# Patient Record
Sex: Female | Born: 1970 | Race: White | Hispanic: No | Marital: Married | Smoking: Current every day smoker
Health system: Southern US, Community
[De-identification: ages and names within clinical notes are randomized; demographics above are authoritative.]

## PROBLEM LIST (undated history)

## (undated) DIAGNOSIS — I10 Essential (primary) hypertension: Secondary | ICD-10-CM

## (undated) DIAGNOSIS — F419 Anxiety disorder, unspecified: Secondary | ICD-10-CM

## (undated) DIAGNOSIS — E7800 Pure hypercholesterolemia, unspecified: Secondary | ICD-10-CM

## (undated) HISTORY — PX: CERVICAL CONE BIOPSY: SUR198

## (undated) HISTORY — PX: TUBAL LIGATION: SHX77

---

## 2001-01-15 ENCOUNTER — Inpatient Hospital Stay (HOSPITAL_COMMUNITY): Admission: AD | Admit: 2001-01-15 | Discharge: 2001-01-15 | Payer: Self-pay | Admitting: *Deleted

## 2001-01-27 ENCOUNTER — Inpatient Hospital Stay (HOSPITAL_COMMUNITY): Admission: AD | Admit: 2001-01-27 | Discharge: 2001-01-29 | Payer: Self-pay | Admitting: Obstetrics & Gynecology

## 2005-08-10 ENCOUNTER — Ambulatory Visit: Payer: Self-pay | Admitting: Oncology

## 2014-12-05 ENCOUNTER — Encounter (HOSPITAL_BASED_OUTPATIENT_CLINIC_OR_DEPARTMENT_OTHER): Payer: Self-pay | Admitting: *Deleted

## 2014-12-05 ENCOUNTER — Emergency Department (HOSPITAL_BASED_OUTPATIENT_CLINIC_OR_DEPARTMENT_OTHER)
Admission: EM | Admit: 2014-12-05 | Discharge: 2014-12-05 | Disposition: A | Discharge status: Home / Self Care | Payer: Managed Care, Other (non HMO) | Attending: Emergency Medicine | Admitting: Emergency Medicine

## 2014-12-05 ENCOUNTER — Emergency Department (HOSPITAL_BASED_OUTPATIENT_CLINIC_OR_DEPARTMENT_OTHER): Payer: Managed Care, Other (non HMO)

## 2014-12-05 DIAGNOSIS — Z79899 Other long term (current) drug therapy: Secondary | ICD-10-CM | POA: Insufficient documentation

## 2014-12-05 DIAGNOSIS — Z792 Long term (current) use of antibiotics: Secondary | ICD-10-CM | POA: Diagnosis not present

## 2014-12-05 DIAGNOSIS — Z720 Tobacco use: Secondary | ICD-10-CM | POA: Diagnosis not present

## 2014-12-05 DIAGNOSIS — Z8639 Personal history of other endocrine, nutritional and metabolic disease: Secondary | ICD-10-CM | POA: Insufficient documentation

## 2014-12-05 DIAGNOSIS — Z8659 Personal history of other mental and behavioral disorders: Secondary | ICD-10-CM | POA: Diagnosis not present

## 2014-12-05 DIAGNOSIS — M25512 Pain in left shoulder: Secondary | ICD-10-CM | POA: Diagnosis not present

## 2014-12-05 DIAGNOSIS — I10 Essential (primary) hypertension: Secondary | ICD-10-CM | POA: Diagnosis not present

## 2014-12-05 DIAGNOSIS — Z880 Allergy status to penicillin: Secondary | ICD-10-CM | POA: Insufficient documentation

## 2014-12-05 DIAGNOSIS — Z7982 Long term (current) use of aspirin: Secondary | ICD-10-CM | POA: Diagnosis not present

## 2014-12-05 DIAGNOSIS — R079 Chest pain, unspecified: Secondary | ICD-10-CM | POA: Diagnosis present

## 2014-12-05 HISTORY — DX: Pure hypercholesterolemia, unspecified: E78.00

## 2014-12-05 HISTORY — DX: Essential (primary) hypertension: I10

## 2014-12-05 HISTORY — DX: Anxiety disorder, unspecified: F41.9

## 2014-12-05 LAB — TROPONIN I: Troponin I: <0.03 ng/mL (ref <0.031)

## 2014-12-05 NOTE — Discharge Instructions (Signed)
You have been diagnosed by your caregiver as having chest wall pain. SEEK IMMEDIATE MEDICAL ATTENTION IF: You develop a fever.  Your chest pains become severe or intolerable.  You develop new, unexplained symptoms (problems).  You develop shortness of breath, nausea, vomiting, sweating or feel light headed.  You develop a new cough or you cough up blood.    Chest Pain (Nonspecific) It is often hard to give a specific diagnosis for the cause of chest pain. There is always a chance that your pain could be related to something serious, such as a heart attack or a blood clot in the lungs. You need to follow up with your health care provider for further evaluation. CAUSES   Heartburn.  Pneumonia or bronchitis.  Anxiety or stress.  Inflammation around your heart (pericarditis) or lung (pleuritis or pleurisy).  A blood clot in the lung.  A collapsed lung (pneumothorax). It can develop suddenly on its own (spontaneous pneumothorax) or from trauma to the chest.  Shingles infection (herpes zoster virus). The chest wall is composed of bones, muscles, and cartilage. Any of these can be the source of the pain.  The bones can be bruised by injury.  The muscles or cartilage can be strained by coughing or overwork.  The cartilage can be affected by inflammation and become sore (costochondritis). DIAGNOSIS  Lab tests or other studies may be needed to find the cause of your pain. Your health care provider may have you take a test called an ambulatory electrocardiogram (ECG). An ECG records your heartbeat patterns over a 24-hour period. You may also have other tests, such as:  Transthoracic echocardiogram (TTE). During echocardiography, sound waves are used to evaluate how blood flows through your heart.  Transesophageal echocardiogram (TEE).  Cardiac monitoring. This allows your health care provider to monitor your heart rate and rhythm in real time.  Holter monitor. This is a portable  device that records your heartbeat and can help diagnose heart arrhythmias. It allows your health care provider to track your heart activity for several days, if needed.  Stress tests by exercise or by giving medicine that makes the heart beat faster. TREATMENT   Treatment depends on what may be causing your chest pain. Treatment may include:  Acid blockers for heartburn.  Anti-inflammatory medicine.  Pain medicine for inflammatory conditions.  Antibiotics if an infection is present.  You may be advised to change lifestyle habits. This includes stopping smoking and avoiding alcohol, caffeine, and chocolate.  You may be advised to keep your head raised (elevated) when sleeping. This reduces the chance of acid going backward from your stomach into your esophagus. Most of the time, nonspecific chest pain will improve within 2-3 days with rest and mild pain medicine.  HOME CARE INSTRUCTIONS   If antibiotics were prescribed, take them as directed. Finish them even if you start to feel better.  For the next few days, avoid physical activities that bring on chest pain. Continue physical activities as directed.  Do not use any tobacco products, including cigarettes, chewing tobacco, or electronic cigarettes.  Avoid drinking alcohol.  Only take medicine as directed by your health care provider.  Follow your health care provider's suggestions for further testing if your chest pain does not go away.  Keep any follow-up appointments you made. If you do not go to an appointment, you could develop lasting (chronic) problems with pain. If there is any problem keeping an appointment, call to reschedule. SEEK MEDICAL CARE IF:   Your  chest pain does not go away, even after treatment.  You have a rash with blisters on your chest.  You have a fever. SEEK IMMEDIATE MEDICAL CARE IF:   You have increased chest pain or pain that spreads to your arm, neck, jaw, back, or abdomen.  You have  shortness of breath.  You have an increasing cough, or you cough up blood.  You have severe back or abdominal pain.  You feel nauseous or vomit.  You have severe weakness.  You faint.  You have chills. This is an emergency. Do not wait to see if the pain will go away. Get medical help at once. Call your local emergency services (911 in [place] Do not drive yourself to the hospital. MAKE SURE YOU:   Understand these instructions.  Will watch your condition.  Will get help right away if you are not doing well or get worse. Document Released: 01/21/2005 Document Revised: 04/18/2013 Document Reviewed: 11/17/2007 [facility] Patient Information 2015 [place], [place]. This information is not intended to replace advice given to you by your health care provider. Make sure you discuss any questions you have with your health care provider.

## 2014-12-05 NOTE — ED Provider Notes (Signed)
CSN: [id number]     Arrival date & time 12/05/14  1706 History   First MD Initiated Contact with Patient 12/05/14 1742     Chief Complaint  Patient presents with  . Chest Pain     (Consider location/radiation/quality/duration/timing/severity/associated sxs/prior Treatment) HPI   Rachel Mccormick Is a 44 year old female with a past medical history of hypertension, hypercholesterolemia, obesity and smoking who presents emergency Department with chief complaint of left chest pain and left arm pain. Patient states her symptoms started several days ago when she developed a bad tooth abscess. She has since had the tooth pulled. Patient complains of left-sided chest pain which she describes as sharp, lasting only seconds at a time, she has associated shortness of breath at that time. She denies nausea, chest pressure, diaphoresis, shortness of breath. She is associated left arm achiness. She states that her left arm pops. She denies severe pain with range of motion, numbness or tingling in the left arm, jaw pain. Patient denies unilateral leg swelling, recent confinement, history of DVTs. She does not take any exogenous estrogens.  Past Medical History  Diagnosis Date  . Hypertension   . Anxiety   . Hypercholesterolemia    Past Surgical History  Procedure Laterality Date  . Cesarean section    . Tubal ligation    . Cervical cone biopsy     No family history on file. Social History  Substance Use Topics  . Smoking status: Current Every Day Smoker  . Smokeless tobacco: None  . Alcohol Use: No   OB History    No data available     Review of Systems  Ten systems reviewed and are negative for acute change, except as noted in the HPI.    Allergies  Effexor; Lisinopril; Penicillins; and Excedrin extra strength  Home Medications   Prior to Admission medications   Medication Sig Start Date End Date Taking? Authorizing Provider  aspirin 81 MG tablet Take 81 mg by mouth daily.   Yes  Historical Provider, MD  clindamycin (CLEOCIN) 300 MG capsule Take 300 mg by mouth 3 (three) times daily.   Yes Historical Provider, MD  gemfibrozil (LOPID) 600 MG tablet Take 600 mg by mouth 2 (two) times daily before a meal.   Yes Historical Provider, MD  LORazepam (ATIVAN) 1 MG tablet Take 1 mg by mouth every 8 (eight) hours.   Yes Historical Provider, MD  metoprolol succinate (TOPROL-XL) 25 MG 24 hr tablet Take 25 mg by mouth daily.   Yes Historical Provider, MD  Multiple Vitamin (MULTIVITAMIN) tablet Take 1 tablet by mouth daily.   Yes Historical Provider, MD  omega-3 acid ethyl esters (LOVAZA) 1 G capsule Take 1 g by mouth 2 (two) times daily.   Yes Historical Provider, MD  oxycodone (OXY-IR) 5 MG capsule Take 5 mg by mouth every 4 (four) hours as needed.   Yes Historical Provider, MD   BP 133/87 mmHg  Pulse 73  Temp(Src) 98.4 F (36.9 C) (Oral)  Resp 18  Ht 5\' 6"  (1.676 m)  Wt 193 lb (87.544 kg)  BMI 31.17 kg/m2  SpO2 99%  LMP 11/21/2014 Physical Exam  Constitutional: She is oriented to person, place, and time. She appears well-developed and well-nourished. No distress.  HENT:  Head: Normocephalic and atraumatic.  Eyes: Conjunctivae are normal. No scleral icterus.  Neck: Normal range of motion.  Cardiovascular: Normal rate, regular rhythm and normal heart sounds.  Exam reveals no gallop and no friction rub.   No murmur  heard. Pulmonary/Chest: Effort normal and breath sounds normal. No respiratory distress.  Abdominal: Soft. Bowel sounds are normal. She exhibits no distension and no mass. There is no tenderness. There is no guarding.  Neurological: She is alert and oriented to person, place, and time.  Skin: Skin is warm and dry. She is not diaphoretic.  Nursing note and vitals reviewed.   ED Course  Procedures (including critical care time) Labs Review Labs Reviewed  TROPONIN I    Imaging Review Dg Chest 2 View  12/05/2014   CLINICAL DATA:  Left-sided chest pain for  1 day  EXAM: CHEST  2 VIEW  COMPARISON:  None.  FINDINGS: There is no edema or consolidation. The heart size and pulmonary vascularity are within normal limits. No adenopathy. There is degenerative change in the thoracic spine. No pneumothorax.  IMPRESSION: No edema or consolidation.   Electronically Signed   By: Lowella Grip III M.D.   On: 12/05/2014 18:16     EKG Interpretation   Date/Time:  Wednesday December 05 2014 17:15:35 EDT Ventricular Rate:  75 PR Interval:  154 QRS Duration: 86 QT Interval:  374 QTC Calculation: 417 R Axis:   41 Text Interpretation:  Normal sinus rhythm Normal ECG No old tracing to  compare Confirmed by Debby Freiberg [id number]) on 12/05/2014 5:17:00 PM      MDM   Final diagnoses:  Chest pain, unspecified chest pain type  Left shoulder pain    Patient with normal EKG, chest x-ray without acute abnormalities, negative troponin. She is perk negative and has a heart score of 2. Do not need a delta troponin. With this patient. Her symptoms are only slightly suspicious for acute coronary syndrome. She is appropriate for discharge at this time. Discussed the case with Dr. Colin Rhein who agrees with plan of care. Stressed reasons to seek immediate medical care.    Margarita Mail, PA-C 12/05/14 2157  Debby Freiberg, MD 12/06/14 [phone]

## 2014-12-05 NOTE — ED Notes (Signed)
Pt c/o left arm pain since last week after being treated for oral infection. Last night she began having chest pressure running under both breasts and around the left side of her chest.

## 2016-12-07 IMAGING — DX DG CHEST 2V
2 series · 2 of 2 positions shown · non-contrast
Comparison: None.

CLINICAL DATA: Left-sided chest pain for 1 day

EXAM:
CHEST  2 VIEW

[chest pa]
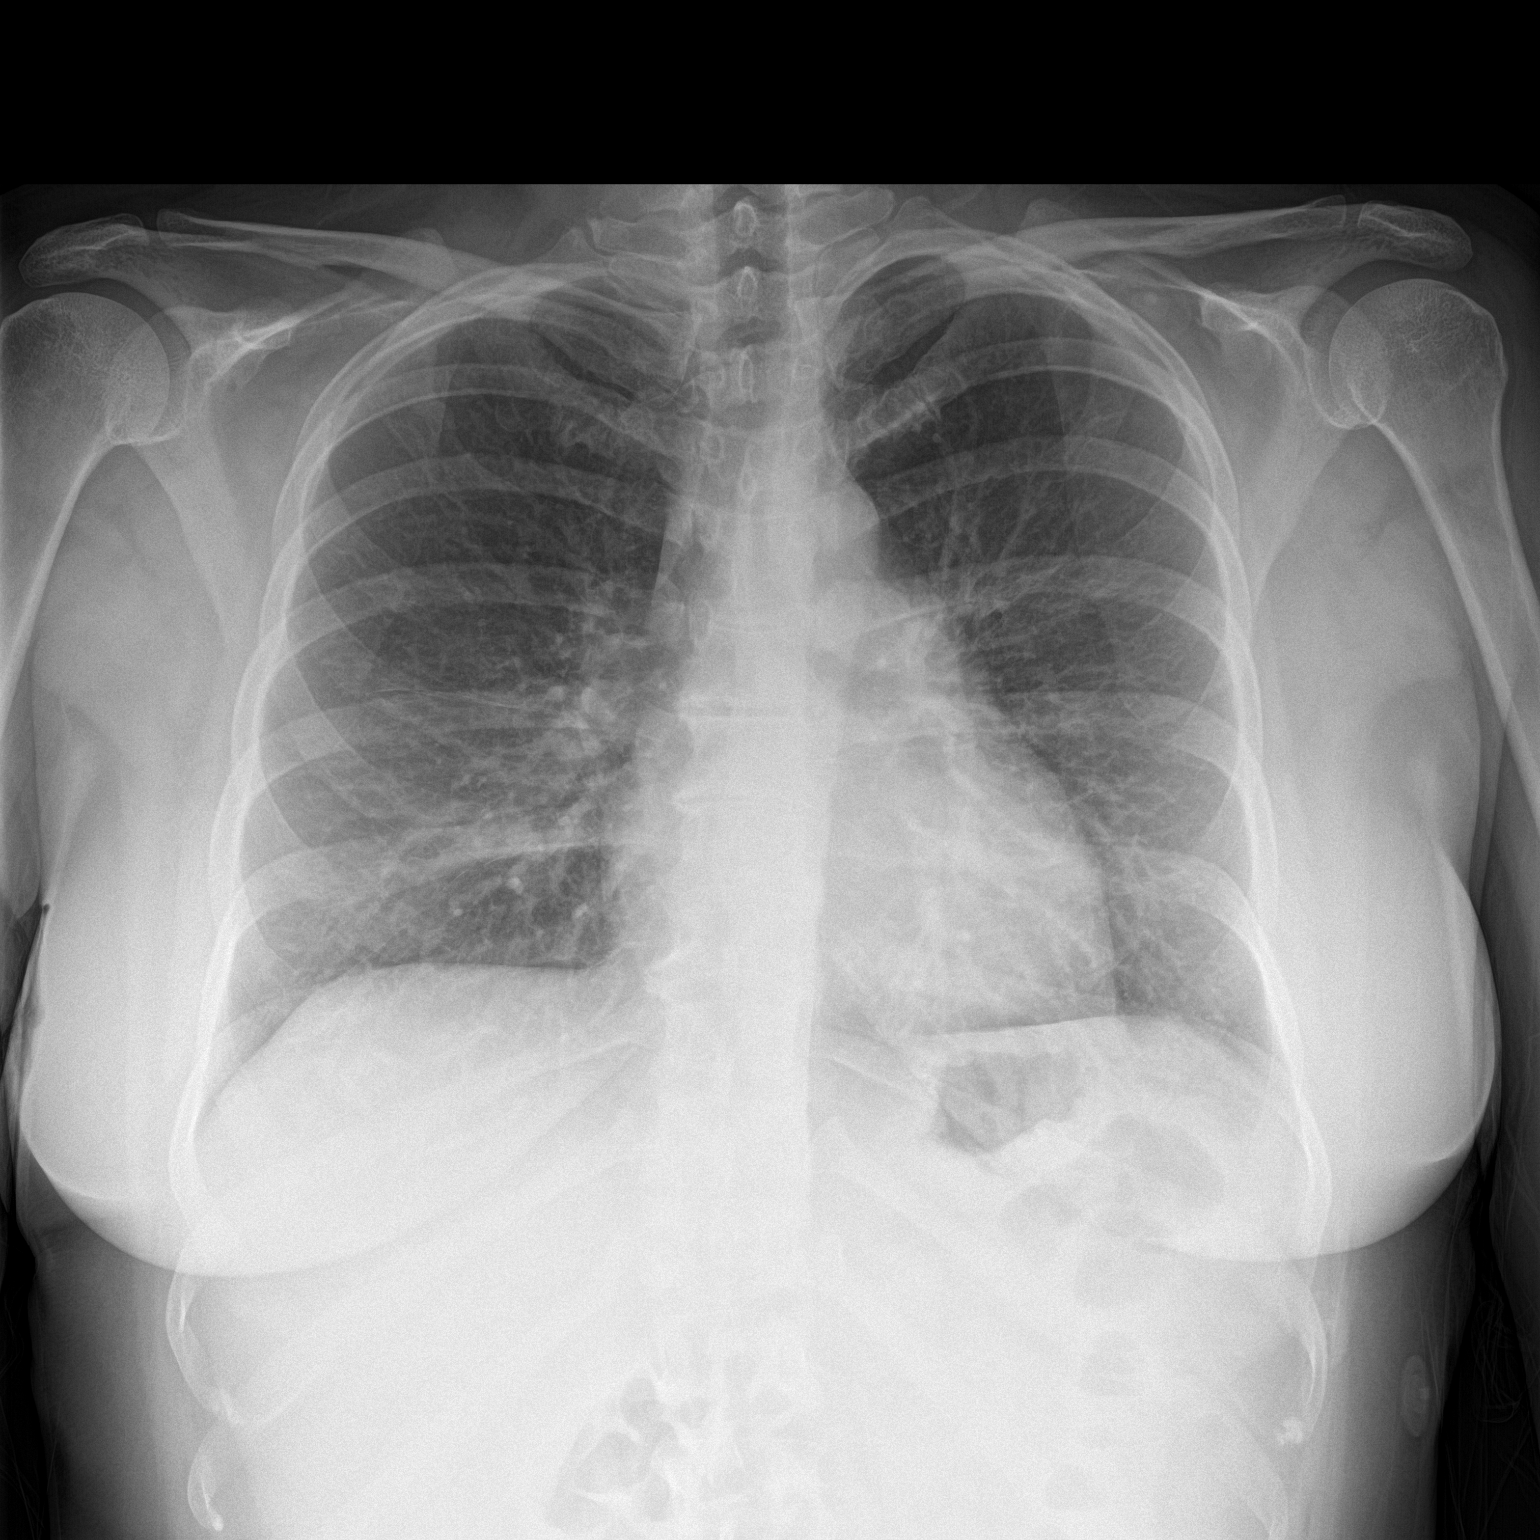

[chest lat]
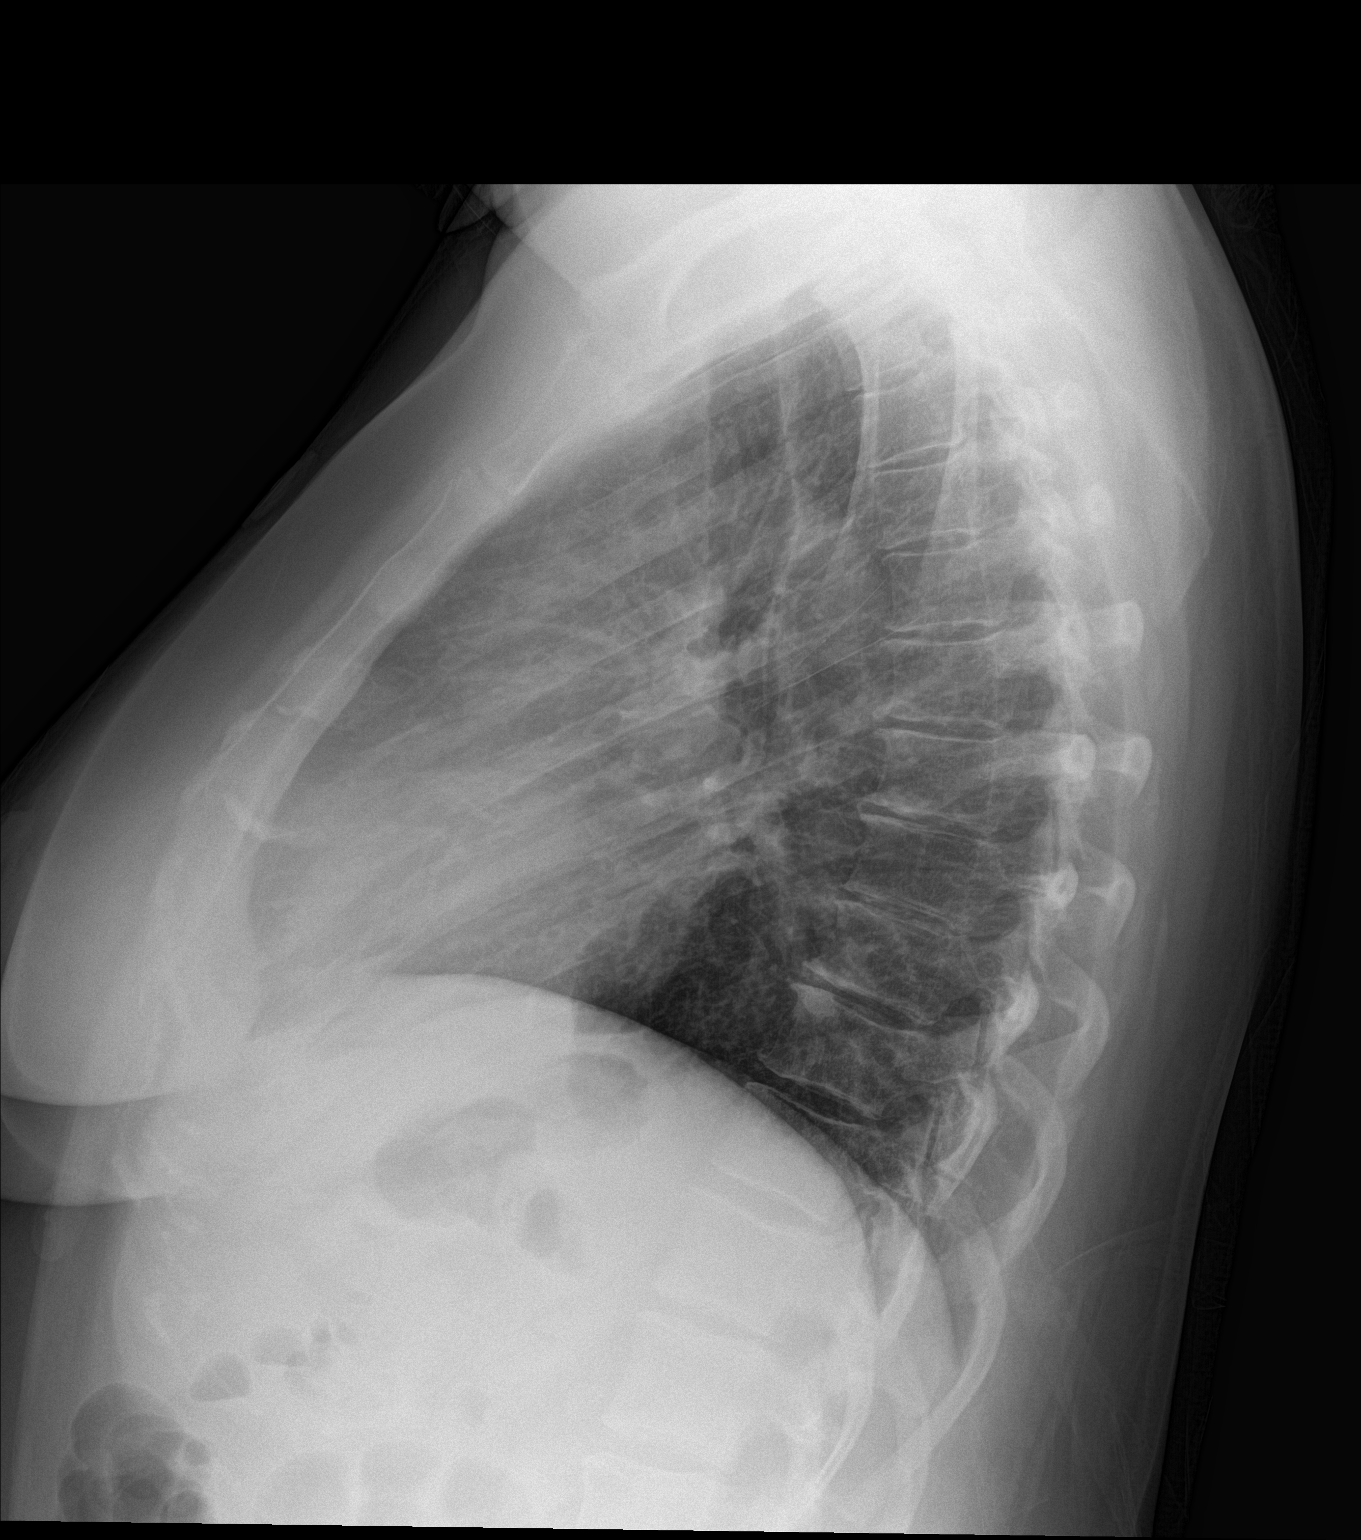

[2 of 2 positions shown; findings below may reference images not displayed]

FINDINGS: There is no edema or consolidation. The heart size and pulmonary
vascularity are within normal limits. No adenopathy. There is
degenerative change in the thoracic spine. No pneumothorax.
IMPRESSION: No edema or consolidation.

## 2017-05-12 DIAGNOSIS — R7301 Impaired fasting glucose: Secondary | ICD-10-CM | POA: Diagnosis not present

## 2017-05-12 DIAGNOSIS — Z79899 Other long term (current) drug therapy: Secondary | ICD-10-CM | POA: Diagnosis not present

## 2017-05-12 DIAGNOSIS — E785 Hyperlipidemia, unspecified: Secondary | ICD-10-CM | POA: Diagnosis not present

## 2017-05-12 DIAGNOSIS — I10 Essential (primary) hypertension: Secondary | ICD-10-CM | POA: Diagnosis not present

## 2017-05-18 DIAGNOSIS — E119 Type 2 diabetes mellitus without complications: Secondary | ICD-10-CM | POA: Diagnosis not present

## 2017-09-03 DIAGNOSIS — E785 Hyperlipidemia, unspecified: Secondary | ICD-10-CM | POA: Diagnosis not present

## 2017-09-03 DIAGNOSIS — I10 Essential (primary) hypertension: Secondary | ICD-10-CM | POA: Diagnosis not present

## 2017-12-03 DIAGNOSIS — Z79899 Other long term (current) drug therapy: Secondary | ICD-10-CM | POA: Diagnosis not present

## 2017-12-03 DIAGNOSIS — I10 Essential (primary) hypertension: Secondary | ICD-10-CM | POA: Diagnosis not present

## 2017-12-03 DIAGNOSIS — E785 Hyperlipidemia, unspecified: Secondary | ICD-10-CM | POA: Diagnosis not present

## 2017-12-03 DIAGNOSIS — E119 Type 2 diabetes mellitus without complications: Secondary | ICD-10-CM | POA: Diagnosis not present

## 2018-04-04 DIAGNOSIS — E785 Hyperlipidemia, unspecified: Secondary | ICD-10-CM | POA: Diagnosis not present

## 2018-04-04 DIAGNOSIS — I10 Essential (primary) hypertension: Secondary | ICD-10-CM | POA: Diagnosis not present

## 2018-04-04 DIAGNOSIS — Z79899 Other long term (current) drug therapy: Secondary | ICD-10-CM | POA: Diagnosis not present

## 2018-04-04 DIAGNOSIS — E1169 Type 2 diabetes mellitus with other specified complication: Secondary | ICD-10-CM | POA: Diagnosis not present

## 2018-07-18 DIAGNOSIS — Z79899 Other long term (current) drug therapy: Secondary | ICD-10-CM | POA: Diagnosis not present

## 2018-07-18 DIAGNOSIS — E1169 Type 2 diabetes mellitus with other specified complication: Secondary | ICD-10-CM | POA: Diagnosis not present

## 2018-07-18 DIAGNOSIS — E785 Hyperlipidemia, unspecified: Secondary | ICD-10-CM | POA: Diagnosis not present

## 2018-07-18 DIAGNOSIS — I10 Essential (primary) hypertension: Secondary | ICD-10-CM | POA: Diagnosis not present

## 2018-07-29 DIAGNOSIS — D225 Melanocytic nevi of trunk: Secondary | ICD-10-CM | POA: Diagnosis not present

## 2018-07-29 DIAGNOSIS — L578 Other skin changes due to chronic exposure to nonionizing radiation: Secondary | ICD-10-CM | POA: Diagnosis not present

## 2018-07-29 DIAGNOSIS — L570 Actinic keratosis: Secondary | ICD-10-CM | POA: Diagnosis not present

## 2018-07-29 DIAGNOSIS — C44529 Squamous cell carcinoma of skin of other part of trunk: Secondary | ICD-10-CM | POA: Diagnosis not present

## 2018-08-02 DIAGNOSIS — C44519 Basal cell carcinoma of skin of other part of trunk: Secondary | ICD-10-CM | POA: Diagnosis not present

## 2018-08-18 DIAGNOSIS — D72829 Elevated white blood cell count, unspecified: Secondary | ICD-10-CM | POA: Diagnosis not present

## 2018-08-18 DIAGNOSIS — D473 Essential (hemorrhagic) thrombocythemia: Secondary | ICD-10-CM | POA: Diagnosis not present

## 2021-02-05 ENCOUNTER — Other Ambulatory Visit: Payer: Self-pay | Admitting: Internal Medicine

## 2021-02-05 DIAGNOSIS — R109 Unspecified abdominal pain: Secondary | ICD-10-CM

## 2021-02-21 ENCOUNTER — Inpatient Hospital Stay: Admission: RE | Admit: 2021-02-21 | Payer: Managed Care, Other (non HMO) | Source: Ambulatory Visit

## 2021-02-21 ENCOUNTER — Other Ambulatory Visit: Payer: Self-pay | Admitting: Internal Medicine

## 2021-02-21 DIAGNOSIS — R1030 Lower abdominal pain, unspecified: Secondary | ICD-10-CM

## 2021-10-08 ENCOUNTER — Other Ambulatory Visit: Payer: Self-pay | Admitting: Internal Medicine

## 2021-10-08 DIAGNOSIS — R748 Abnormal levels of other serum enzymes: Secondary | ICD-10-CM

## 2021-10-20 ENCOUNTER — Ambulatory Visit
Admission: RE | Admit: 2021-10-20 | Discharge: 2021-10-20 | Disposition: A | Discharge status: Home / Self Care | Payer: 59 | Source: Ambulatory Visit | Attending: Internal Medicine | Admitting: Internal Medicine

## 2021-10-20 DIAGNOSIS — R748 Abnormal levels of other serum enzymes: Secondary | ICD-10-CM

## 2024-05-31 ENCOUNTER — Emergency Department (HOSPITAL_BASED_OUTPATIENT_CLINIC_OR_DEPARTMENT_OTHER)

## 2024-05-31 ENCOUNTER — Emergency Department (HOSPITAL_BASED_OUTPATIENT_CLINIC_OR_DEPARTMENT_OTHER)
Admission: EM | Admit: 2024-05-31 | Discharge: 2024-05-31 | Disposition: A | Discharge status: Home / Self Care | Attending: Emergency Medicine | Admitting: Emergency Medicine

## 2024-05-31 ENCOUNTER — Other Ambulatory Visit: Payer: Self-pay

## 2024-05-31 ENCOUNTER — Encounter (HOSPITAL_BASED_OUTPATIENT_CLINIC_OR_DEPARTMENT_OTHER): Payer: Self-pay | Admitting: Emergency Medicine

## 2024-05-31 DIAGNOSIS — Z79899 Other long term (current) drug therapy: Secondary | ICD-10-CM | POA: Insufficient documentation

## 2024-05-31 DIAGNOSIS — J40 Bronchitis, not specified as acute or chronic: Secondary | ICD-10-CM | POA: Insufficient documentation

## 2024-05-31 DIAGNOSIS — R791 Abnormal coagulation profile: Secondary | ICD-10-CM | POA: Insufficient documentation

## 2024-05-31 DIAGNOSIS — I10 Essential (primary) hypertension: Secondary | ICD-10-CM | POA: Insufficient documentation

## 2024-05-31 DIAGNOSIS — R911 Solitary pulmonary nodule: Secondary | ICD-10-CM | POA: Insufficient documentation

## 2024-05-31 DIAGNOSIS — Z87891 Personal history of nicotine dependence: Secondary | ICD-10-CM | POA: Insufficient documentation

## 2024-05-31 DIAGNOSIS — J189 Pneumonia, unspecified organism: Secondary | ICD-10-CM | POA: Insufficient documentation

## 2024-05-31 DIAGNOSIS — I159 Secondary hypertension, unspecified: Secondary | ICD-10-CM

## 2024-05-31 DIAGNOSIS — Z7982 Long term (current) use of aspirin: Secondary | ICD-10-CM | POA: Insufficient documentation

## 2024-05-31 LAB — CBC WITH DIFFERENTIAL/PLATELET
Abs Immature Granulocytes: 0.05 10*3/uL (ref 0.00–0.07)
Basophils Absolute: 0.1 10*3/uL (ref 0.0–0.1)
Basophils Relative: 1 %
Eosinophils Absolute: 0.2 10*3/uL (ref 0.0–0.5)
Eosinophils Relative: 2 %
HCT: 43.9 % (ref 36.0–46.0)
Hemoglobin: 15 g/dL (ref 12.0–15.0)
Immature Granulocytes: 0 %
Lymphocytes Relative: 20 %
Lymphs Abs: 3 10*3/uL (ref 0.7–4.0)
MCH: 31 pg (ref 26.0–34.0)
MCHC: 34.2 g/dL (ref 30.0–36.0)
MCV: 90.7 fL (ref 80.0–100.0)
Monocytes Absolute: 0.8 10*3/uL (ref 0.1–1.0)
Monocytes Relative: 5 %
Neutro Abs: 11.1 10*3/uL — ABNORMAL HIGH (ref 1.7–7.7)
Neutrophils Relative %: 72 %
Platelets: 336 10*3/uL (ref 150–400)
RBC: 4.84 MIL/uL (ref 3.87–5.11)
RDW: 13.4 % (ref 11.5–15.5)
WBC: 15.3 10*3/uL — ABNORMAL HIGH (ref 4.0–10.5)
nRBC: 0 % (ref 0.0–0.2)

## 2024-05-31 LAB — COMPREHENSIVE METABOLIC PANEL WITH GFR
ALT: 14 U/L (ref 0–44)
AST: 25 U/L (ref 15–41)
Albumin: 4.1 g/dL (ref 3.5–5.0)
Alkaline Phosphatase: 124 U/L (ref 38–126)
Anion gap: 12 (ref 5–15)
BUN: 6 mg/dL (ref 6–20)
CO2: 23 mmol/L (ref 22–32)
Calcium: 9.1 mg/dL (ref 8.9–10.3)
Chloride: 103 mmol/L (ref 98–111)
Creatinine, Ser: 0.7 mg/dL (ref 0.44–1.00)
GFR, Estimated: >60 mL/min
Glucose, Bld: 171 mg/dL — ABNORMAL HIGH (ref 70–99)
Potassium: 3.6 mmol/L (ref 3.5–5.1)
Sodium: 138 mmol/L (ref 135–145)
Total Bilirubin: 0.3 mg/dL (ref 0.0–1.2)
Total Protein: 7.6 g/dL (ref 6.5–8.1)

## 2024-05-31 LAB — LIPASE, BLOOD: Lipase: 59 U/L — ABNORMAL HIGH (ref 11–51)

## 2024-05-31 LAB — TROPONIN T, HIGH SENSITIVITY
Troponin T High Sensitivity: <6 ng/L (ref 0–19)
Troponin T High Sensitivity: 7 ng/L (ref 0–19)

## 2024-05-31 LAB — D-DIMER, QUANTITATIVE: D-Dimer, Quant: 0.61 ug{FEU}/mL — ABNORMAL HIGH (ref 0.00–0.50)

## 2024-05-31 MED ORDER — DOXYCYCLINE HYCLATE 100 MG PO CAPS: 100.0000 mg | ORAL_CAPSULE | Freq: Two times a day (BID) | ORAL | 0 refills | Status: AC

## 2024-05-31 MED ORDER — DOXYCYCLINE HYCLATE 100 MG PO TABS
100.0000 mg | ORAL_TABLET | Freq: Once | ORAL | Status: AC
  Administered 2024-05-31: 100 mg via ORAL
  Filled 2024-05-31: qty 1

## 2024-05-31 MED ORDER — PREDNISONE 20 MG PO TABS: 40.0000 mg | ORAL_TABLET | Freq: Every day | ORAL | 0 refills | Status: AC

## 2024-05-31 MED ORDER — IOHEXOL 350 MG/ML SOLN
75.0000 mL | Freq: Once | INTRAVENOUS | Status: AC | PRN
  Administered 2024-05-31: 75 mL via INTRAVENOUS

## 2024-05-31 MED ORDER — AMLODIPINE BESYLATE 5 MG PO TABS
5.0000 mg | ORAL_TABLET | Freq: Once | ORAL | Status: AC
  Administered 2024-05-31: 5 mg via ORAL
  Filled 2024-05-31: qty 1

## 2024-05-31 MED ORDER — METHYLPREDNISOLONE SODIUM SUCC 125 MG IJ SOLR
125.0000 mg | Freq: Once | INTRAMUSCULAR | Status: AC
  Administered 2024-05-31: 125 mg via INTRAVENOUS
  Filled 2024-05-31: qty 2

## 2024-05-31 MED ORDER — PREDNISONE 50 MG PO TABS: 60.0000 mg | ORAL_TABLET | Freq: Once | ORAL | Status: DC

## 2024-05-31 MED ORDER — ALBUTEROL SULFATE HFA 108 (90 BASE) MCG/ACT IN AERS
2.0000 | INHALATION_SPRAY | Freq: Once | RESPIRATORY_TRACT | Status: AC
  Administered 2024-05-31: 2 via RESPIRATORY_TRACT
  Filled 2024-05-31: qty 6.7

## 2024-05-31 MED ORDER — AMLODIPINE BESYLATE 5 MG PO TABS: 5.0000 mg | ORAL_TABLET | Freq: Every day | ORAL | 0 refills | Status: AC

## 2024-05-31 NOTE — ED Notes (Signed)
 Pt. Reports she started having this pain on yesterday and it got worse today.

## 2024-05-31 NOTE — ED Triage Notes (Addendum)
 right Chest /breast pain , through her back to left shoulder , pain worse with coughing , heavy smoker . Denies Shortness of breath  .  No NV.

## 2024-05-31 NOTE — Discharge Instructions (Addendum)
 Take antibiotic as prescribed and steroid as prescribed.  Take your next dose tomorrow morning.  Follow-up with your primary care doctor regarding your pulmonary nodule and further evaluation and monitoring of this to make sure that this is not a cancerous process or develops into 1.  Use albuterol  inhaler 2 to 4 puffs every 4-6 hours as needed.  Return if symptoms worsen.  Continue Tylenol 1000 mg every 6 hours.  Continue amlodipine  for high blood pressure.

## 2024-05-31 NOTE — ED Provider Notes (Signed)
 " [facility] Provider Note   CSN: [id number] Arrival date & time: 05/31/24  1958     Patient presents with: Chest Pain   Rachel Mccormick is a 54 y.o. female.   Patient here for cough shortness of breath chest pain.  History of smoking.  History of reactive airway process.  History of high blood pressure anxiety high cholesterol.  Symptoms here for the last few days.  She has had a cough.  Pain mostly in the right side of her chest.  No recent surgery or travel.  Denies any exertional symptoms.  No nausea or vomiting.  No weakness numbness tingling.  The history is provided by the patient.       Prior to Admission medications  Medication Sig Start Date End Date Taking? Authorizing Provider  doxycycline  (VIBRAMYCIN ) 100 MG capsule Take 1 capsule (100 mg total) by mouth 2 (two) times daily. 05/31/24  Yes Levina Boyack, DO  predniSONE  (DELTASONE ) 20 MG tablet Take 2 tablets (40 mg total) by mouth daily for 4 days. 05/31/24 06/04/24 Yes Kaelah Hayashi, DO  aspirin 81 MG tablet Take 81 mg by mouth daily.    [provider]  clindamycin (CLEOCIN) 300 MG capsule Take 300 mg by mouth 3 (three) times daily.    [provider]  gemfibrozil (LOPID) 600 MG tablet Take 600 mg by mouth 2 (two) times daily before a meal.    [provider]  LORazepam (ATIVAN) 1 MG tablet Take 1 mg by mouth every 8 (eight) hours.    [provider]  metoprolol succinate (TOPROL-XL) 25 MG 24 hr tablet Take 25 mg by mouth daily.    [provider]  Multiple Vitamin (MULTIVITAMIN) tablet Take 1 tablet by mouth daily.    [provider]  omega-3 acid ethyl esters (LOVAZA) 1 G capsule Take 1 g by mouth 2 (two) times daily.    [provider]  oxycodone (OXY-IR) 5 MG capsule Take 5 mg by mouth every 4 (four) hours as needed.    [provider]    Allergies: Effexor [venlafaxine], Lisinopril, Penicillins, and Excedrin  extra strength [aspirin-acetaminophen-caffeine]    Review of Systems  Updated Vital Signs BP (!) 190/80   Pulse 71   Temp 98.2 F (36.8 C)   Resp 15   Wt 98.9 kg   LMP 11/21/2014   SpO2 95%   BMI 35.19 kg/m   Physical Exam Vitals and nursing note reviewed.  Constitutional:      General: She is not in acute distress.    Appearance: She is well-developed. She is not ill-appearing.  HENT:     Head: Normocephalic and atraumatic.  Eyes:     Conjunctiva/sclera: Conjunctivae normal.     Pupils: Pupils are equal, round, and reactive to light.  Cardiovascular:     Rate and Rhythm: Normal rate and regular rhythm.     Pulses:          Radial pulses are 2+ on the right side and 2+ on the left side.     Heart sounds: No murmur heard. Pulmonary:     Effort: Pulmonary effort is normal. No respiratory distress.     Breath sounds: Wheezing present.  Abdominal:     Palpations: Abdomen is soft.     Tenderness: There is no abdominal tenderness.  Musculoskeletal:        General: No swelling.     Cervical back: Neck supple.  Right lower leg: No edema.     Left lower leg: No edema.  Skin:    General: Skin is warm and dry.     Capillary Refill: Capillary refill takes less than 2 seconds.  Neurological:     Mental Status: She is alert.  Psychiatric:        Mood and Affect: Mood normal.     (all labs ordered are listed, but only abnormal results are displayed) Labs Reviewed  CBC WITH DIFFERENTIAL/PLATELET - Abnormal; Notable for the following components:      Result Value   WBC 15.3 (*)    Neutro Abs 11.1 (*)    All other components within normal limits  COMPREHENSIVE METABOLIC PANEL WITH GFR - Abnormal; Notable for the following components:   Glucose, Bld 171 (*)    All other components within normal limits  LIPASE, BLOOD - Abnormal; Notable for the following components:   Lipase 59 (*)    All other components within normal limits  D-DIMER, QUANTITATIVE - Abnormal; Notable  for the following components:   D-Dimer, Quant 0.61 (*)    All other components within normal limits  TROPONIN T, HIGH SENSITIVITY  TROPONIN T, HIGH SENSITIVITY    EKG: None  Radiology: CT Angio Chest PE W and/or Wo Contrast Result Date: 05/31/2024 EXAM: CTA of the Chest with contrast for PE 05/31/2024 09:27:37 PM TECHNIQUE: CTA of the chest was performed after the administration of 75 mL of iohexol  (OMNIPAQUE ) 350 MG/ML injection. Multiplanar reformatted images are provided for review. MIP images are provided for review. Automated exposure control, iterative reconstruction, and/or weight based adjustment of the mA/kV was utilized to reduce the radiation dose to as low as reasonably achievable. COMPARISON: CT angiogram chest 09/24/1999. CLINICAL HISTORY: Pulmonary embolism (PE) suspected, high prob. Suspected pulmonary embolism (PE), high probability. FINDINGS: PULMONARY ARTERIES: Pulmonary arteries are adequately opacified for evaluation. No pulmonary embolism. Main pulmonary artery is normal in caliber. MEDIASTINUM: The heart is enlarged. There is no acute abnormality of the thoracic aorta. LYMPH NODES: No mediastinal, hilar or axillary lymphadenopathy. LUNGS AND PLEURA: Mild emphysema present. There are diffuse patchy ground glass opacities throughout both lungs. There is a new 1 cm pulmonary nodule in the left upper lobe (image 12/42). No pleural effusion or pneumothorax. UPPER ABDOMEN: Right renal cyst is present measuring 13 mm. SOFT TISSUES AND BONES: Post on tear present. There are periphery calcified right thyroid nodules measuring up to 13 mm. IMPRESSION: 1. No evidence of pulmonary embolism. 2. New 1 cm left upper lobe pulmonary nodule, for which consider non-contrast chest CT at 3 months, PET/CT, or tissue sampling as per Fleischner Society Guidelines. 3. Diffuse patchy ground glass opacities throughout both lungs suspicious for infectious/inflammatory process. 4. Mild emphysema, which is an  independent risk factor for lung cancer, and recommend consideration for evaluation for a low-dose CT lung cancer screening program. Electronically signed by: Greig Pique MD 05/31/2024 09:38 PM EST RP Workstation: [id number]   DG Chest Portable 1 View Result Date: 05/31/2024 EXAM: 1 VIEW XRAY OF THE CHEST 05/31/2024 08:21:00 PM COMPARISON: Chest x-ray dated 09/24/2019. CLINICAL HISTORY: Chest pain. FINDINGS: LUNGS AND PLEURA: 1 cm nodule in left upper lung zone. No pleural effusion. No pneumothorax. HEART AND MEDIASTINUM: No acute abnormality of the cardiac and mediastinal silhouettes. BONES AND SOFT TISSUES: No acute osseous abnormality. IMPRESSION: 1. 1 cm pulmonary nodule in the left upper lung. Recommend further evaluation with chest CT. Electronically signed by: Greig Pique MD 05/31/2024 08:28 PM EST  RP Workstation: [id number]     Procedures   Medications Ordered in the ED  amLODipine  (NORVASC ) tablet 5 mg (5 mg Oral Given 05/31/24 2111)  iohexol  (OMNIPAQUE ) 350 MG/ML injection 75 mL (75 mLs Intravenous Contrast Given 05/31/24 2117)  doxycycline  (VIBRA -TABS) tablet 100 mg (100 mg Oral Given 05/31/24 2149)  methylPREDNISolone  sodium succinate (SOLU-MEDROL ) 125 mg/2 mL injection 125 mg (125 mg Intravenous Given 05/31/24 2150)  albuterol  (VENTOLIN  HFA) 108 (90 Base) MCG/ACT inhaler 2 puff (2 puffs Inhalation Given 05/31/24 2201)                                    Medical Decision Making Amount and/or Complexity of Data Reviewed Labs: ordered. Radiology: ordered.  Risk Prescription drug management.   Rachel Mccormick is here with chest pain cough shortness of breath.  Admits to smoking history.  Blood pressure elevated upon arrival.  She feels like she has a bronchitis.  Differential diagnosis bronchitis/COPD type exacerbation pneumonia versus less likely ACS PE.  Have no concern for dissection.  Despite high blood pressure she has normal pulses throughout.  She is well-appearing.  Does not appear to  be in any major discomfort.  She had wheezing on exam.  Will give her breathing treatments steroids and pursue workup with lab work imaging D-dimer.  EKG shows sinus rhythm.  No ischemic changes.  Will put her on amlodipine  for blood pressure.  Per my review and interpretation labs she has a white count of 15.  Troponin negative x 2.  D-dimer was elevated.  Lab work otherwise unremarkable.  Chest x-ray was reassuring we will get a CT scan to further evaluate given elevated D-dimer.  CT scan showed no evidence of PE but does show a new 1 cm left upper lobe pulmonary nodule.  Likely needs repeat imaging in a couple months and she was made aware of this.  Otherwise it does look like she has infectious/inflammatory process in the lungs likely mild emphysema as well.  Will give her a dose of antibiotic and steroid here.  She is feeling better after breathing treatments.  Ultimately I do think that this is bronchitis/inflammatory/infectious process.  Have no concern for ACS or other acute process.  She is made aware of the pulmonary nodule need for further workup and management and monitoring with her primary care doctor.  Will put her on amlodipine  for better blood pressure control.  Will put her on doxycycline  and prednisone  for infectious inflammatory process and she will follow-up with her primary care doctor regarding pulmonary nodule.  Discharged in good condition.  Understands return precautions.  This chart was dictated using voice recognition software.  Despite best efforts to proofread,  errors can occur which can change the documentation meaning.      Final diagnoses:  Community acquired pneumonia, unspecified laterality  Bronchitis  Pulmonary nodule    ED Discharge Orders          Ordered    predniSONE  (DELTASONE ) 20 MG tablet  Daily        05/31/24 2242    doxycycline  (VIBRAMYCIN ) 100 MG capsule  2 times daily        05/31/24 2242               Ruthe Cornet, DO 05/31/24  2246  "

## 2024-05-31 NOTE — ED Notes (Signed)
 Pt. Came back from CT scan and walked to restroom with no difficulty and no shortness of breath noted.
